# Patient Record
Sex: Male | Born: 1980 | Hispanic: Yes | Marital: Single | Smoking: Current some day smoker
Health system: Southern US, Community
[De-identification: ages and names within clinical notes are randomized; demographics above are authoritative.]

---

## 2019-06-23 ENCOUNTER — Other Ambulatory Visit: Payer: Self-pay

## 2019-06-23 ENCOUNTER — Ambulatory Visit (HOSPITAL_COMMUNITY)
Admission: EM | Admit: 2019-06-23 | Discharge: 2019-06-23 | Disposition: A | Discharge status: Home / Self Care | Payer: Self-pay | Attending: Family Medicine | Admitting: Family Medicine

## 2019-06-23 ENCOUNTER — Encounter (HOSPITAL_COMMUNITY): Payer: Self-pay

## 2019-06-23 DIAGNOSIS — L03115 Cellulitis of right lower limb: Secondary | ICD-10-CM

## 2019-06-23 MED ORDER — CEPHALEXIN 500 MG PO CAPS: 500.0000 mg | ORAL_CAPSULE | Freq: Two times a day (BID) | ORAL | 0 refills | Status: AC

## 2019-06-23 NOTE — ED Triage Notes (Signed)
Patient presents to Urgent Care with complaints of right knee pain since almost a week ago. Patient reports it has continued to swell, patient tried to pop it, but nothing came out.

## 2019-06-23 NOTE — ED Provider Notes (Signed)
[place]    CSN: [id number] Arrival date & time: 06/23/19  1133      History   Chief Complaint Chief Complaint  Patient presents with  . Knee Pain    Right    HPI Rodney Carr is a 38 y.o. male.   He is presenting with right knee pain.  The pain is been ongoing for a few days.  Is localized to the knee.  He is having redness and warmth.  Denies any loss of range of motion.  Denies any fevers.  He had a area that he did express some discharge from.  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  Denies any specific trauma or inciting event.  Has tried several conservative measures with no improvement.  HPI  History reviewed. No pertinent past medical history.  There are no active problems to display for this patient.   History reviewed. No pertinent surgical history.     Home Medications    Prior to Admission medications   Medication Sig Start Date End Date Taking? Authorizing Provider  cephALEXin (KEFLEX) 500 MG capsule Take 1 capsule (500 mg total) by mouth 2 (two) times daily. 06/23/19   Rosemarie Ax, MD    Family History Family History  Problem Relation Age of Onset  . Healthy Mother   . Diabetes Father   . Hypertension Father     Social History Social History   Tobacco Use  . Smoking status: Never Smoker  . Smokeless tobacco: Never Used  Substance Use Topics  . Alcohol use: Yes    Comment: socially  . Drug use: Not on file     Allergies   Patient has no known allergies.   Review of Systems Review of Systems  Constitutional: Negative for fever.  HENT: Negative for congestion.   Respiratory: Negative for cough.   Cardiovascular: Negative for chest pain.  Gastrointestinal: Negative for abdominal pain.  Musculoskeletal: Negative for joint swelling.  Skin: Positive for color change.  Neurological: Negative for weakness.  Hematological: Negative for adenopathy.     Physical Exam Triage Vital Signs ED Triage Vitals  Enc Vitals  Group     BP 06/23/19 1155 (!) 155/83     Pulse Rate 06/23/19 1155 79     Resp 06/23/19 1155 16     Temp 06/23/19 1155 98.8 F (37.1 C)     Temp Source 06/23/19 1155 Oral     SpO2 06/23/19 1155 99 %     Weight --      Height --      Head Circumference --      Peak Flow --      Pain Score 06/23/19 1153 7     Pain Loc --      Pain Edu? --      Excl. in [place]? --    No data found.  Updated Vital Signs BP (!) 155/83 (BP Location: Left Arm)   Pulse 79   Temp 98.8 F (37.1 C) (Oral)   Resp 16   SpO2 99%   Visual Acuity Right Eye Distance:   Left Eye Distance:   Bilateral Distance:    Right Eye Near:   Left Eye Near:    Bilateral Near:     Physical Exam Gen: NAD, alert, cooperative with exam, well-appearing ENT: normal lips, normal nasal mucosa,  Eye: normal EOM, normal conjunctiva and lids CV:  no edema, +2 pedal pulses   Resp: no accessory muscle use,  non-labored,  Skin: no areas of induration  Neuro: normal tone, normal sensation to touch Psych:  normal insight, alert and oriented MSK:  Right knee: Erythema and warmth over the distal quad.   Has some streaking proximally. Normal range of motion. Normal strength resistance. Neurovascular intact    UC Treatments / Results  Labs (all labs ordered are listed, but only abnormal results are displayed) Labs Reviewed - No data to display  EKG   Radiology No results found.  Procedures Procedures (including critical care time)  Medications Ordered in UC Medications - No data to display  Initial Impression / Assessment and Plan / UC Course  I have reviewed the triage vital signs and the nursing notes.  Pertinent labs & imaging results that were available during my care of the patient were reviewed by me and considered in my medical decision making (see chart for details).     Rodney Carr is a 38 year old male that is presenting with cellulitis.  Does not appear to be a septic joint with good range of motion.  Do  not appreciate any abscess formation.  Provided Keflex and counseled on supportive care.  Given indications to return or seek more immediate care.  Final Clinical Impressions(s) / UC Diagnoses   Final diagnoses:  Cellulitis of right lower extremity     Discharge Instructions     Please take the antibiotics Please try ice  Please try tylenol or ibuprofen for pain  Please follow up.     ED Prescriptions    Medication Sig Dispense Auth. Provider   cephALEXin (KEFLEX) 500 MG capsule Take 1 capsule (500 mg total) by mouth 2 (two) times daily. 14 capsule Myra RudeSchmitz, Dewight Catino E, MD     Controlled Substance Prescriptions [place] Controlled Substance Registry consulted? Not Applicable   Myra RudeSchmitz, Yong Wahlquist E, MD 06/23/19 [phone]

## 2019-06-23 NOTE — Discharge Instructions (Addendum)
Please take the antibiotics Please try ice  Please try tylenol or ibuprofen for pain  Please follow up.

## 2019-06-26 ENCOUNTER — Encounter: Payer: Self-pay | Admitting: Family Medicine

## 2019-06-26 ENCOUNTER — Ambulatory Visit (INDEPENDENT_AMBULATORY_CARE_PROVIDER_SITE_OTHER): Payer: Self-pay | Admitting: Family Medicine

## 2019-06-26 ENCOUNTER — Other Ambulatory Visit: Payer: Self-pay

## 2019-06-26 ENCOUNTER — Ambulatory Visit: Payer: Self-pay

## 2019-06-26 VITALS — BP 146/88 | Ht 66.0 in | Wt 182.0 lb

## 2019-06-26 DIAGNOSIS — L0291 Cutaneous abscess, unspecified: Secondary | ICD-10-CM

## 2019-06-26 DIAGNOSIS — L03115 Cellulitis of right lower limb: Secondary | ICD-10-CM

## 2019-06-26 NOTE — Patient Instructions (Signed)
Good to see you Please finish the antibiotics.  Please elevate the knee  Please send me a message in MyChart with any questions or updates.  Please see me back in one week if it hasn't improved.   --Dr. Raeford Razor

## 2019-06-26 NOTE — Progress Notes (Signed)
Ritter Helsley - 38 y.o. male MRN [id number]  Date of birth: 10-04-1981  SUBJECTIVE:  Including CC & ROS.  Chief Complaint  Patient presents with  . Knee Pain    right knee x 10 days    Paolo Okane is a 38 y.o. male that is presenting with right knee pain.  He was evaluated at the urgent care on 9 6.  He was diagnosed with cellulitis and started on antibiotic.  His redness seems to have improved since that time but he still has expressed discharge from the area.  Denies any changes in his range of motion.  Denies any fevers or chills.  The pain is mild in nature.  Has been occurring for roughly 10 days.  Denies any puncture into the wound.  The entirety of this appointment was conducted with a Spanish in person interpreter.  Review of Systems  Constitutional: Negative for fever.  HENT: Negative for congestion.   Respiratory: Negative for cough.   Cardiovascular: Negative for chest pain.  Gastrointestinal: Negative for abdominal pain.  Musculoskeletal: Negative for gait problem.  Skin: Positive for color change.  Neurological: Negative for weakness.  Hematological: Negative for adenopathy.    HISTORY: Past Medical, Surgical, Social, and Family History Reviewed & Updated per EMR.   Pertinent Historical Findings include:  No past medical history on file.  No past surgical history on file.  No Known Allergies  Family History  Problem Relation Age of Onset  . Healthy Mother   . Diabetes Father   . Hypertension Father      Social History   Socioeconomic History  . Marital status: Single    Spouse name: Not on file  . Number of children: Not on file  . Years of education: Not on file  . Highest education level: Not on file  Occupational History  . Not on file  Social Needs  . Financial resource strain: Not on file  . Food insecurity    Worry: Not on file    Inability: Not on file  . Transportation needs    Medical: Not on file    Non-medical: Not on file  Tobacco Use   . Smoking status: Never Smoker  . Smokeless tobacco: Never Used  Substance and Sexual Activity  . Alcohol use: Yes    Comment: socially  . Drug use: Not on file  . Sexual activity: Not on file  Lifestyle  . Physical activity    Days per week: Not on file    Minutes per session: Not on file  . Stress: Not on file  Relationships  . Social Herbalist on phone: Not on file    Gets together: Not on file    Attends religious service: Not on file    Active member of club or organization: Not on file    Attends meetings of clubs or organizations: Not on file    Relationship status: Not on file  . Intimate partner violence    Fear of current or ex partner: Not on file    Emotionally abused: Not on file    Physically abused: Not on file    Forced sexual activity: Not on file  Other Topics Concern  . Not on file  Social History Narrative  . Not on file     PHYSICAL EXAM:  VS: BP (!) 146/88   Ht 5\' 6"  (1.676 m)   Wt 182 lb (82.6 kg)   BMI 29.38 kg/m  Physical Exam  Gen: NAD, alert, cooperative with exam, well-appearing ENT: normal lips, normal nasal mucosa,  Eye: normal EOM, normal conjunctiva and lids CV:  no edema, +2 pedal pulses   Resp: no accessory muscle use, non-labored,  Skin: no rashes, areas of induration around the proximal patella   Neuro: normal tone, normal sensation to touch Psych:  normal insight, alert and oriented MSK:  Right knee:  Overlying wound but no fluctuance.  Normal knee range of motion. No significant streaking. Neurovascular intact  Limited ultrasound: Right knee:  Mild effusion within the suprapatellar pouch but no suggestion of septic joint. There appears to be abscess that is superficial to the patella and quad tendon.  There is significant cobblestoning to suggest infection and soft tissue swelling  Summary: Abscess and not likely for septic joint  Ultrasound and interpretation by Clare GandyJeremy Tehani Mersman, MD  Incision and Drainage  Procedure Note:  The affected area was cleaned and draped in a sterile fashion. Anesthesia was achieved using 6 mL of 1% Lidocaine without epinephrine injected around the wound area using a 25-guage 1.5 inch needle. An 10-blade scalpel was used to incise the wound. A culture was obtained. A cotton q tip was used to break any loculations that were present.  A sterile dressing was applied to the area. The patient tolerated the procedure well. No complications were encountered.      ASSESSMENT & PLAN:   Abscess He's been having improvement with the ABX but abscess found on US exam. Not suggestive of septic joint  - I&D - wound culture  - continue ABX  - counseled on supportive care - f/u in one week if not improving.

## 2019-06-27 DIAGNOSIS — L0291 Cutaneous abscess, unspecified: Secondary | ICD-10-CM | POA: Insufficient documentation

## 2019-06-27 NOTE — Assessment & Plan Note (Signed)
He's been having improvement with the ABX but abscess found on US exam. Not suggestive of septic joint  - I&D - wound culture  - continue ABX  - counseled on supportive care - f/u in one week if not improving.

## 2019-06-28 ENCOUNTER — Other Ambulatory Visit: Payer: Self-pay

## 2019-06-28 ENCOUNTER — Other Ambulatory Visit: Payer: Self-pay | Admitting: Internal Medicine

## 2019-06-28 ENCOUNTER — Ambulatory Visit
Admission: RE | Admit: 2019-06-28 | Discharge: 2019-06-28 | Disposition: A | Discharge status: Home / Self Care | Payer: No Typology Code available for payment source | Source: Ambulatory Visit | Attending: Internal Medicine | Admitting: Internal Medicine

## 2019-06-28 DIAGNOSIS — Z111 Encounter for screening for respiratory tuberculosis: Secondary | ICD-10-CM

## 2019-06-30 LAB — WOUND CULTURE

## 2019-07-01 ENCOUNTER — Telehealth: Payer: Self-pay | Admitting: Family Medicine

## 2019-07-01 MED ORDER — SULFAMETHOXAZOLE-TRIMETHOPRIM 800-160 MG PO TABS: 1.0000 | ORAL_TABLET | Freq: Two times a day (BID) | ORAL | 0 refills | Status: AC

## 2019-07-01 NOTE — Telephone Encounter (Signed)
Informed patient's friend of wound culture result. Will send in bactrim.   Rosemarie Ax, MD [facility] 07/01/2019, 9:20 AM

## 2020-01-13 ENCOUNTER — Ambulatory Visit (HOSPITAL_COMMUNITY)
Admission: EM | Admit: 2020-01-13 | Discharge: 2020-01-13 | Disposition: A | Discharge status: Home / Self Care | Payer: No Typology Code available for payment source | Attending: Family Medicine | Admitting: Family Medicine

## 2020-01-13 ENCOUNTER — Other Ambulatory Visit: Payer: Self-pay

## 2020-01-13 ENCOUNTER — Encounter (HOSPITAL_COMMUNITY): Payer: Self-pay

## 2020-01-13 DIAGNOSIS — R21 Rash and other nonspecific skin eruption: Secondary | ICD-10-CM

## 2020-01-13 DIAGNOSIS — L247 Irritant contact dermatitis due to plants, except food: Secondary | ICD-10-CM

## 2020-01-13 MED ORDER — DEXAMETHASONE SODIUM PHOSPHATE 10 MG/ML IJ SOLN
10.0000 mg | Freq: Once | INTRAMUSCULAR | Status: AC
  Administered 2020-01-13: 10 mg via INTRAMUSCULAR

## 2020-01-13 MED ORDER — PREDNISONE 10 MG (21) PO TBPK: ORAL_TABLET | Freq: Every day | ORAL | 0 refills | Status: AC

## 2020-01-13 MED ORDER — CLOBETASOL PROPIONATE 0.05 % EX CREA: 1.0000 "application " | TOPICAL_CREAM | Freq: Two times a day (BID) | CUTANEOUS | 0 refills | Status: AC

## 2020-01-13 MED ORDER — DEXAMETHASONE SODIUM PHOSPHATE 10 MG/ML IJ SOLN
INTRAMUSCULAR | Status: AC
  Filled 2020-01-13: qty 1

## 2020-01-13 MED ORDER — HYDROXYZINE HCL 25 MG PO TABS: 25.0000 mg | ORAL_TABLET | Freq: Four times a day (QID) | ORAL | 0 refills | Status: AC

## 2020-01-13 NOTE — Discharge Instructions (Addendum)
You have contact dermatitis. This is a rash that appears when your skin is exposed to an irritant.   Use the steroid cream no more than three times per day.  Follow up with your primary care provider, or to see us as needed.  Report to the emergency room for shortness of breath, high fever, severe diarrhea, or other concerning symptoms.  

## 2020-01-13 NOTE — ED Provider Notes (Signed)
Rodney Carr    CSN: [id number] Arrival date & time: 01/13/20  1023      History   Chief Complaint Chief Complaint  Patient presents with  . Rash    HPI Rodney Carr is a 39 y.o. male.   Patient reports itchy rash that showed up 3 days ago.  He reports he was working outside helping his neighbor take down a tree.  He reports there is no pain with the rash.  Reports that he has been taking Benadryl at home, and that it works temporarily but then the itching comes right back.  Patient reports that he has had poison ivy and poison oak in his life before.  Patient reports that the rash is spreading, that is on his forearms, neck, face.  Patient reports he has not applied anything topically to help this.  Patient denies headache, cough, shortness of breath, chest tightness, nausea, vomiting, diarrhea, chills, body aches, fever, other symptoms.  No significant medical history per chart review.  The history is provided by the patient.    History reviewed. No pertinent past medical history.  Patient Active Problem List   Diagnosis Date Noted  . Abscess 06/27/2019    History reviewed. No pertinent surgical history.     Home Medications    Prior to Admission medications   Medication Sig Start Date End Date Taking? Authorizing Provider  cephALEXin (KEFLEX) 500 MG capsule Take 1 capsule (500 mg total) by mouth 2 (two) times daily. 06/23/19   Rosemarie Ax, MD  clobetasol cream (TEMOVATE) 2.13 % Apply 1 application topically 2 (two) times daily. 01/13/20   Faustino Congress, NP  hydrOXYzine (ATARAX/VISTARIL) 25 MG tablet Take 1 tablet (25 mg total) by mouth every 6 (six) hours. 01/13/20   Faustino Congress, NP  predniSONE (STERAPRED UNI-PAK 21 TAB) 10 MG (21) TBPK tablet Take by mouth daily for 6 days. Take 6 tablets on day 1, 5 tablets on day 2, 4 tablets on day 3, 3 tablets on day 4, 2 tablets on day 5, 1 tablet on day 6 01/13/20 01/19/20  Faustino Congress, NP    sulfamethoxazole-trimethoprim (BACTRIM DS) 800-160 MG tablet Take 1 tablet by mouth 2 (two) times daily. 07/01/19   Rosemarie Ax, MD    Family History Family History  Problem Relation Age of Onset  . Healthy Mother   . Diabetes Father   . Hypertension Father     Social History Social History   Tobacco Use  . Smoking status: Current Some Day Smoker  . Smokeless tobacco: Never Used  . Tobacco comment: 1-2 per day  Substance Use Topics  . Alcohol use: Yes    Comment: socially  . Drug use: Not on file     Allergies   Patient has no known allergies.   Review of Systems Review of Systems  Constitutional: Negative for chills and fever.  HENT: Negative for ear pain and sore throat.   Eyes: Negative for pain and visual disturbance.  Respiratory: Negative for cough and shortness of breath.   Cardiovascular: Negative for chest pain and palpitations.  Gastrointestinal: Negative for abdominal pain and vomiting.  Genitourinary: Negative for dysuria and hematuria.  Musculoskeletal: Negative for arthralgias and back pain.  Skin: Positive for rash. Negative for color change.       To face, neck, forearms.  Neurological: Negative for seizures and syncope.  All other systems reviewed and are negative.    Physical Exam Triage Vital Signs ED Triage Vitals [01/13/20  1051]  Enc Vitals Group     BP      Pulse      Resp      Temp      Temp src      SpO2      Weight      Height      Head Circumference      Peak Flow      Pain Score 0     Pain Loc      Pain Edu?      Excl. in [place]?    No data found.  Updated Vital Signs BP 140/81 (BP Location: Right Arm)   Pulse 78   Temp 99 F (37.2 C) (Oral)   Resp 14   SpO2 100%   Visual Acuity Right Eye Distance:   Left Eye Distance:   Bilateral Distance:    Right Eye Near:   Left Eye Near:    Bilateral Near:     Physical Exam Vitals and nursing note reviewed.  Constitutional:      General: He is not in acute  distress.    Appearance: Normal appearance. He is well-developed and normal weight. He is not ill-appearing.  HENT:     Head: Normocephalic and atraumatic.     Nose: Nose normal.     Mouth/Throat:     Mouth: Mucous membranes are moist.     Pharynx: Oropharynx is clear.  Eyes:     Extraocular Movements: Extraocular movements intact.     Conjunctiva/sclera: Conjunctivae normal.     Pupils: Pupils are equal, round, and reactive to light.  Cardiovascular:     Rate and Rhythm: Normal rate and regular rhythm.     Heart sounds: No murmur.  Pulmonary:     Effort: Pulmonary effort is normal. No respiratory distress.     Breath sounds: Normal breath sounds.  Abdominal:     General: Bowel sounds are normal. There is no distension.     Palpations: Abdomen is soft. There is no mass.     Tenderness: There is no abdominal tenderness. There is no guarding or rebound.     Hernia: No hernia is present.  Musculoskeletal:     Cervical back: Neck supple.  Skin:    General: Skin is warm and dry.     Capillary Refill: Capillary refill takes less than 2 seconds.     Findings: Rash present.     Comments: Red, raised, vesicular papular rash present to forearms, ACs, neck, face including eyelids.  Neurological:     General: No focal deficit present.     Mental Status: He is alert and oriented to person, place, and time.  Psychiatric:        Mood and Affect: Mood normal.        Behavior: Behavior normal.        Thought Content: Thought content normal.      UC Treatments / Results  Labs (all labs ordered are listed, but only abnormal results are displayed) Labs Reviewed - No data to display  EKG   Radiology No results found.  Procedures Procedures (including critical care time)  Medications Ordered in UC Medications  dexamethasone (DECADRON) injection 10 mg (10 mg Intramuscular Given 01/13/20 1131)    Initial Impression / Assessment and Plan / UC Course  I have reviewed the triage  vital signs and the nursing notes.  Pertinent labs & imaging results that were available during my care of the patient were reviewed by  me and considered in my medical decision making (see chart for details).     Presents with irritant contact dermatitis due to unknown plant.  Likely poison ivy or poison oak.  Has vesicular papular rash, urticaria to both forearms, ACs, neck and face including eyelids.  Patient received Decadron 10 mg IM in office today.  Prescribed Pred pack for 6 days.  Also gave clobetasol cream and instructed patient not to put this on his face but he may use for arms and neck.  Prescribed hydroxyzine for itching, as Benadryl is not helping at this point.  Patient instructed to go to the ER for trouble swallowing, trouble breathing, other concerning symptoms. Final Clinical Impressions(s) / UC Diagnoses   Final diagnoses:  Rash  Irritant contact dermatitis due to plants, except food     Discharge Instructions     You have contact dermatitis. This is a rash that appears when your skin is exposed to an irritant.   Use the steroid cream no more than three times per day.  Follow up with your primary care provider, or to see [place] as needed.  Report to the emergency room for shortness of breath, high fever, severe diarrhea, or other concerning symptoms.     ED Prescriptions    Medication Sig Dispense Auth. Provider   predniSONE (STERAPRED UNI-PAK 21 TAB) 10 MG (21) TBPK tablet Take by mouth daily for 6 days. Take 6 tablets on day 1, 5 tablets on day 2, 4 tablets on day 3, 3 tablets on day 4, 2 tablets on day 5, 1 tablet on day 6 21 tablet Moshe Cipro, NP   clobetasol cream (TEMOVATE) 0.05 % Apply 1 application topically 2 (two) times daily. 60 g Moshe Cipro, NP   hydrOXYzine (ATARAX/VISTARIL) 25 MG tablet Take 1 tablet (25 mg total) by mouth every 6 (six) hours. 12 tablet Moshe Cipro, NP     PDMP not reviewed this encounter.   Moshe Cipro, NP 01/15/20 0825

## 2020-01-13 NOTE — ED Triage Notes (Signed)
Patient reports he noticed an itchy rash on his right arm Saturday evening after working outside in the yard. Reports that it is not painful, but very itchy and has spread over his entire body.

## 2020-10-01 ENCOUNTER — Other Ambulatory Visit: Payer: Self-pay

## 2020-10-01 ENCOUNTER — Encounter (HOSPITAL_COMMUNITY): Payer: Self-pay

## 2020-10-01 ENCOUNTER — Ambulatory Visit (HOSPITAL_COMMUNITY)
Admission: EM | Admit: 2020-10-01 | Discharge: 2020-10-01 | Disposition: A | Discharge status: Home / Self Care | Payer: No Typology Code available for payment source | Attending: Student | Admitting: Student

## 2020-10-01 DIAGNOSIS — L255 Unspecified contact dermatitis due to plants, except food: Secondary | ICD-10-CM

## 2020-10-01 MED ORDER — METHYLPREDNISOLONE SODIUM SUCC 125 MG IJ SOLR
INTRAMUSCULAR | Status: AC
  Filled 2020-10-01: qty 2

## 2020-10-01 MED ORDER — METHYLPREDNISOLONE SODIUM SUCC 125 MG IJ SOLR
80.0000 mg | Freq: Once | INTRAMUSCULAR | Status: AC
  Administered 2020-10-01: 80 mg via INTRAMUSCULAR

## 2020-10-01 MED ORDER — PREDNISONE 10 MG (21) PO TBPK: ORAL_TABLET | Freq: Every day | ORAL | 0 refills | Status: AC

## 2020-10-01 NOTE — ED Triage Notes (Signed)
Pt presents with a rash arms, face and neck x 2 days. Pt states he believes it is poison ivy. Pt denies fever.

## 2020-10-01 NOTE — ED Provider Notes (Signed)
[facility]    CSN: [id number] Arrival date & time: 10/01/20  1924      History   Chief Complaint Chief Complaint  Patient presents with   Poison Ivy    HPI Rodney Carr is a 39 y.o. male presenting for poison ivy rash on face, neck for 2 days. States rash is very itchy. 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 History of poisonivy in the past due to working outside. Denies fevers, chills. Denies vision changes, denies eye involvement. Feeling well otherwise.   HPI  History reviewed. No pertinent past medical history.  Patient Active Problem List   Diagnosis Date Noted   Abscess 06/27/2019    History reviewed. No pertinent surgical history.     Home Medications    Prior to Admission medications   Medication Sig Start Date End Date Taking? Authorizing Provider  cephALEXin (KEFLEX) 500 MG capsule Take 1 capsule (500 mg total) by mouth 2 (two) times daily. 06/23/19   Myra Rude, MD  clobetasol cream (TEMOVATE) 0.05 % Apply 1 application topically 2 (two) times daily. 01/13/20   Moshe Cipro, NP  hydrOXYzine (ATARAX/VISTARIL) 25 MG tablet Take 1 tablet (25 mg total) by mouth every 6 (six) hours. 01/13/20   Moshe Cipro, NP  predniSONE (STERAPRED UNI-PAK 21 TAB) 10 MG (21) TBPK tablet Take by mouth daily. Take 6 tabs by mouth daily  for 2 days, then 5 tabs for 2 days, then 4 tabs for 2 days, then 3 tabs for 2 days, 2 tabs for 2 days, then 1 tab by mouth daily for 2 days 10/01/20   Rhys Martini, PA-C  sulfamethoxazole-trimethoprim (BACTRIM DS) 800-160 MG tablet Take 1 tablet by mouth 2 (two) times daily. 07/01/19   Myra Rude, MD    Family History Family History  Problem Relation Age of Onset   Healthy Mother    Diabetes Father    Hypertension Father     Social History Social History   Tobacco Use   Smoking status: Current Some Day Smoker   Smokeless tobacco: Never Used   Tobacco comment: 1-2 per day  Vaping Use    Vaping Use: Never used  Substance Use Topics   Alcohol use: Yes    Comment: socially     Allergies   Patient has no known allergies.   Review of Systems Review of Systems  Skin: Positive for rash.     Physical Exam Triage Vital Signs ED Triage Vitals  Enc Vitals Group     BP 10/01/20 2017 129/83     Pulse Rate 10/01/20 2017 73     Resp 10/01/20 2017 17     Temp 10/01/20 2017 98.4 F (36.9 C)     Temp Source 10/01/20 2017 Oral     SpO2 10/01/20 2017 100 %     Weight --      Height --      Head Circumference --      Peak Flow --      Pain Score 10/01/20 2016 7     Pain Loc --      Pain Edu? --      Excl. in [place]? --    No data found.  Updated Vital Signs BP 129/83 (BP Location: Right Arm)    Pulse 73    Temp 98.4 F (36.9 C) (Oral)    Resp 17    SpO2 100%   Visual Acuity Right Eye Distance:   Left Eye Distance:  Bilateral Distance:    Right Eye Near:   Left Eye Near:    Bilateral Near:     Physical Exam Vitals reviewed.  Constitutional:      Appearance: Normal appearance.  HENT:     Head: Normocephalic and atraumatic.  Cardiovascular:     Rate and Rhythm: Normal rate and regular rhythm.     Heart sounds: Normal heart sounds.  Pulmonary:     Effort: Pulmonary effort is normal.     Breath sounds: Normal breath sounds.  Skin:    Comments: Urticarial rash on face, neck, bilateral forearms. Few excoriations.   Neurological:     Mental Status: He is alert.      UC Treatments / Results  Labs (all labs ordered are listed, but only abnormal results are displayed) Labs Reviewed - No data to display  EKG   Radiology No results found.  Procedures Procedures (including critical care time)  Medications Ordered in UC Medications  methylPREDNISolone sodium succinate (SOLU-MEDROL) 125 mg/2 mL injection 80 mg (has no administration in time range)    Initial Impression / Assessment and Plan / UC Course  I have reviewed the triage vital signs and  the nursing notes.  Pertinent labs & imaging results that were available during my care of the patient were reviewed by me and considered in my medical decision making (see chart for details).     Pt is not diabetic. Solu-medrol injection administered today and script for prednisone taper sent. He can also try benedryl for symptomatic relief.  Final Clinical Impressions(s) / UC Diagnoses   Final diagnoses:  Rhus dermatitis     Discharge Instructions     Take the Prednisone for your poison ivy. You'll take 6 tabs (pills) by mouth daily for 2 days, then 5 tabs for 2 days, then 4 tabs for 2 days, then 3 tabs for 2 days, 2 tabs for 2 days, then 1 tab by mouth daily for 2 days. This medicine may make you feel energetic, so take it in the morning. You can also take Benedryl to help with the itching.     ED Prescriptions    Medication Sig Dispense Auth. Provider   predniSONE (STERAPRED UNI-PAK 21 TAB) 10 MG (21) TBPK tablet Take by mouth daily. Take 6 tabs by mouth daily  for 2 days, then 5 tabs for 2 days, then 4 tabs for 2 days, then 3 tabs for 2 days, 2 tabs for 2 days, then 1 tab by mouth daily for 2 days 42 tablet Rhys Martini, PA-C     PDMP not reviewed this encounter.   Rhys Martini, PA-C 10/01/20 2051

## 2020-10-01 NOTE — Discharge Instructions (Addendum)
Take the Prednisone for your poison ivy. You'll take 6 tabs (pills) by mouth daily for 2 days, then 5 tabs for 2 days, then 4 tabs for 2 days, then 3 tabs for 2 days, 2 tabs for 2 days, then 1 tab by mouth daily for 2 days. This medicine may make you feel energetic, so take it in the morning. You can also take Benedryl to help with the itching.

## 2021-02-16 IMAGING — CR DG CHEST 1V
1 series · 1 of 1 positions shown · non-contrast
Comparison: None.

CLINICAL DATA: Positive PPD

EXAM:
CHEST  1 VIEW

[w chest pa]
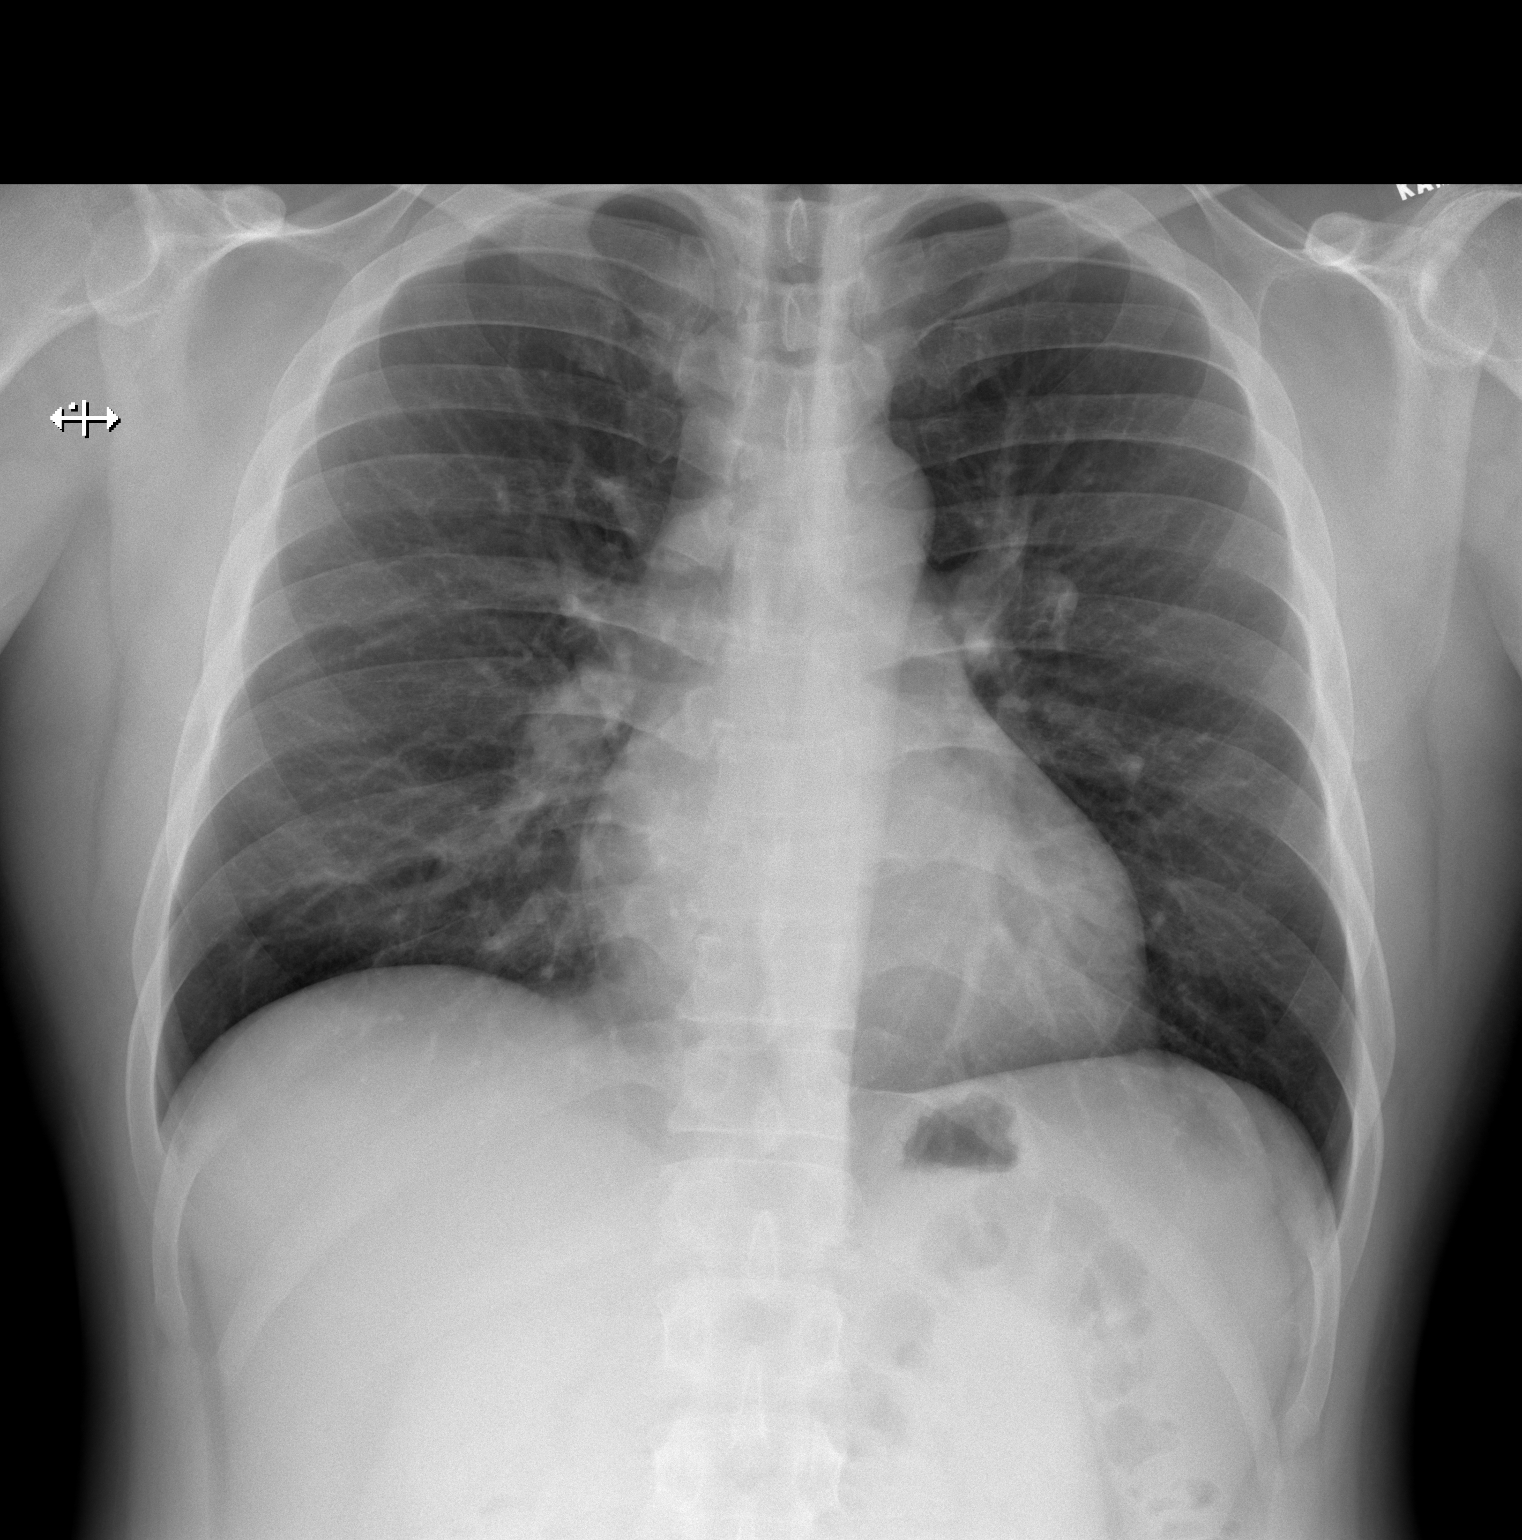

[1 of 1 positions shown; findings below may reference images not displayed]

FINDINGS: The heart size and mediastinal contours are within normal limits.
Both lungs are clear. The visualized skeletal structures are
unremarkable.
IMPRESSION: No active disease.

## 2023-02-02 ENCOUNTER — Encounter: Payer: Self-pay | Admitting: *Deleted
